# Patient Record
Sex: Female | Born: 1995 | Race: White | Hispanic: No | Marital: Single | Smoking: Never smoker
Health system: Southern US, Academic
[De-identification: ages and names within clinical notes are randomized; demographics above are authoritative.]

## PROBLEM LIST (undated history)

## (undated) ENCOUNTER — Inpatient Hospital Stay (HOSPITAL_COMMUNITY): Payer: Self-pay

## (undated) DIAGNOSIS — N926 Irregular menstruation, unspecified: Secondary | ICD-10-CM

## (undated) DIAGNOSIS — S0300XA Dislocation of jaw, unspecified side, initial encounter: Secondary | ICD-10-CM

## (undated) DIAGNOSIS — R519 Headache, unspecified: Secondary | ICD-10-CM

## (undated) DIAGNOSIS — Z789 Other specified health status: Secondary | ICD-10-CM

## (undated) HISTORY — PX: NO PAST SURGERIES: SHX2092

## (undated) HISTORY — DX: Headache, unspecified: R51.9

## (undated) HISTORY — PX: HX OTHER: 2100001105

## (undated) HISTORY — PX: HX NO SURGICAL PROCEDURES: 2100001501

## (undated) HISTORY — DX: Dislocation of jaw, unspecified side, initial encounter: S03.00XA

## (undated) HISTORY — DX: Irregular menstruation, unspecified: N92.6

---
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
## 2015-01-14 ENCOUNTER — Encounter (HOSPITAL_BASED_OUTPATIENT_CLINIC_OR_DEPARTMENT_OTHER): Payer: Self-pay | Admitting: Advanced Practice Midwife

## 2015-01-14 ENCOUNTER — Ambulatory Visit: Payer: 59 | Attending: Advanced Practice Midwife | Admitting: Advanced Practice Midwife

## 2015-01-14 VITALS — BP 114/58 | Wt 188.5 lb

## 2015-01-14 DIAGNOSIS — Z3492 Encounter for supervision of normal pregnancy, unspecified, second trimester: Secondary | ICD-10-CM | POA: Insufficient documentation

## 2015-01-14 DIAGNOSIS — Z3A16 16 weeks gestation of pregnancy: Secondary | ICD-10-CM | POA: Insufficient documentation

## 2015-01-14 DIAGNOSIS — Z3490 Encounter for supervision of normal pregnancy, unspecified, unspecified trimester: Secondary | ICD-10-CM

## 2015-01-14 DIAGNOSIS — Z3402 Encounter for supervision of normal first pregnancy, second trimester: Secondary | ICD-10-CM

## 2015-01-14 NOTE — Progress Notes (Addendum)
Patient seen by Tona SensingLena Cerbone CNM  HEre for rOB with boyfriend Gerilyn PilgrimJacob  Has been less nauseated.  No longer taking Phenergan because doesn't need.  Labs reviewed, all WNL  FAmily history updated.  First trimester screen done but 14 weeks so no result -- patient declines second trimester screening.  Moving to [place] after next visit.  Will fill out ROR at that time  Weight: 85.5 kg (188 lb 7.9 oz)  BP (Non-Invasive): (!) 114/58 mmHg    A:  IUP at [id number]      S=d    P:  OB warning signs including general s/s miscarriage, vaginal bleeding, abdominal cramping, timing of fetal movement, and unusual vaginal discharge discussed and reviewed.       RTO 4 weeks, for ROB and u/s, sooner prn.    CNM saw patient independently.  Physician available on-site for consultation, collaboration, or referral as per CNM certification and licensure.    Tona SensingLena Cerbone, CNM

## 2015-02-11 ENCOUNTER — Ambulatory Visit
Admission: RE | Admit: 2015-02-11 | Discharge: 2015-02-11 | Disposition: A | Discharge status: Home / Self Care | Payer: 59 | Source: Ambulatory Visit | Attending: Advanced Practice Midwife | Admitting: Advanced Practice Midwife

## 2015-02-11 ENCOUNTER — Encounter (HOSPITAL_BASED_OUTPATIENT_CLINIC_OR_DEPARTMENT_OTHER): Payer: Self-pay | Admitting: Advanced Practice Midwife

## 2015-02-11 ENCOUNTER — Ambulatory Visit (HOSPITAL_BASED_OUTPATIENT_CLINIC_OR_DEPARTMENT_OTHER): Payer: 59 | Admitting: Advanced Practice Midwife

## 2015-02-11 VITALS — BP 114/62 | Ht 70.0 in | Wt 194.7 lb

## 2015-02-11 DIAGNOSIS — Z3492 Encounter for supervision of normal pregnancy, unspecified, second trimester: Secondary | ICD-10-CM

## 2015-02-11 DIAGNOSIS — Z331 Pregnant state, incidental: Secondary | ICD-10-CM

## 2015-02-11 DIAGNOSIS — Z3490 Encounter for supervision of normal pregnancy, unspecified, unspecified trimester: Secondary | ICD-10-CM

## 2015-02-11 DIAGNOSIS — Z3A20 20 weeks gestation of pregnancy: Secondary | ICD-10-CM | POA: Insufficient documentation

## 2015-02-11 DIAGNOSIS — Z36 Encounter for antenatal screening of mother: Secondary | ICD-10-CM | POA: Insufficient documentation

## 2015-02-11 NOTE — Progress Notes (Addendum)
Patient seen by Tona Sensing CNM  Here for ROB with boyfriend Gerilyn Pilgrim  U/S today, all WNL s=d  It's a boy!  No name yet  Gas pain noted.  Comfort measures and OTC meds.  Had an episode of lightheadedness -- ate and got better.  Discussed and reassured.  Asks about size of baby right now.  Discussed.  Asks about sleeping on back.  Discussed.  Weight: 88.3 kg (194 lb 10.7 oz)  BP (Non-Invasive): 114/62 mmHg  Protein: Negative  Glucose: Negative  Ketones: Negative    A:  IUP at [redacted]w[redacted]d       S=d    P:  OB Warning s/s including SROM, vaginal bleeding, decreased fetal movement, and contractions reviewed.        RTO 4 weeks, sooner prn.    CNM saw patient independently.  Physician available by phone for consultation, collaboration, or referral as per CNM certification and licensure.    Tona Sensing, CNM  Jola Babinski, MD  02/14/2015, 08:29

## 2015-04-27 ENCOUNTER — Inpatient Hospital Stay (HOSPITAL_COMMUNITY)
Admission: AD | Admit: 2015-04-27 | Discharge: 2015-04-28 | Disposition: A | Discharge status: Home / Self Care | Payer: Managed Care, Other (non HMO) | Source: Ambulatory Visit | Attending: Obstetrics & Gynecology | Admitting: Obstetrics & Gynecology

## 2015-04-27 ENCOUNTER — Encounter (HOSPITAL_COMMUNITY): Payer: Self-pay | Admitting: Medical

## 2015-04-27 DIAGNOSIS — R102 Pelvic and perineal pain: Secondary | ICD-10-CM | POA: Insufficient documentation

## 2015-04-27 DIAGNOSIS — O26893 Other specified pregnancy related conditions, third trimester: Secondary | ICD-10-CM | POA: Insufficient documentation

## 2015-04-27 DIAGNOSIS — Z3A31 31 weeks gestation of pregnancy: Secondary | ICD-10-CM | POA: Insufficient documentation

## 2015-04-27 DIAGNOSIS — D72829 Elevated white blood cell count, unspecified: Secondary | ICD-10-CM

## 2015-04-27 DIAGNOSIS — O321XX0 Maternal care for breech presentation, not applicable or unspecified: Secondary | ICD-10-CM | POA: Insufficient documentation

## 2015-04-27 DIAGNOSIS — R1031 Right lower quadrant pain: Secondary | ICD-10-CM

## 2015-04-27 HISTORY — DX: Other specified health status: Z78.9

## 2015-04-27 NOTE — MAU Note (Signed)
Pt states she began having pain in her right lower abd at 2200, also began having vomiting at that time. Denies bleeding.

## 2015-04-27 NOTE — MAU Provider Note (Signed)
History     CSN: [id number]  Arrival date and time: 04/27/15 2323   First Provider Initiated Contact with Patient 04/27/15 2356      No chief complaint on file.  HPI  Linda Carter is a 19 y.o. G1P0 at [redacted]w[redacted]d who presents to MAU today with complaint of RLQ abdominal pain since 2200 tonight. She states pain is rated at 5/10 now. She states pain is worse with movement, standing and ambulation. She denies previous issues with similar pain. She denies vaginal bleeding, contractions, LOF, UTI symptoms fever or complications with the pregnancy. She reports good fetal movement. She had one episode of N/V after the pain started.   OB History    Gravida Para Term Preterm AB TAB SAB Ectopic Multiple Living   1               Past Medical History  Diagnosis Date  . Medical history non-contributory     History reviewed. No pertinent past surgical history.  History reviewed. No pertinent family history.  Social History  Substance Use Topics  . Smoking status: Never Smoker   . Smokeless tobacco: Never Used  . Alcohol Use: No    Allergies: No Known Allergies  Prescriptions prior to admission  Medication Sig Dispense Refill Last Dose  . Prenatal Vit-Fe Fumarate-FA (PRENATAL MULTIVITAMIN) TABS tablet Take 1 tablet by mouth daily at 12 noon.       Review of Systems  Constitutional: Negative for fever and malaise/fatigue.  Gastrointestinal: Positive for nausea, vomiting and abdominal pain. Negative for diarrhea and constipation.  Genitourinary: Negative for dysuria, urgency and frequency.       Neg - vaginal bleeding, discharge, LOF   Physical Exam   Blood pressure 123/78, pulse 110, temperature 98 F (36.7 C), temperature source Oral, resp. rate 16, height 5\' 10"  (1.778 m), weight 199 lb (90.266 kg), SpO2 97 %.  Physical Exam  Nursing note and vitals reviewed. Constitutional: She is oriented to person, place, and time. She appears well-developed and well-nourished. No  distress.  HENT:  Head: Normocephalic and atraumatic.  Cardiovascular: Normal rate.   Respiratory: Effort normal.  GI: Soft. She exhibits no distension and no mass. There is tenderness (moderate tenderness to palpation to the RLQ and mild in LLQ). There is no rebound and no guarding.  Neurological: She is alert and oriented to person, place, and time.  Skin: Skin is warm and dry. No erythema.  Psychiatric: She has a normal mood and affect.   Results for orders placed or performed during the hospital encounter of 04/27/15 (from the past 24 hour(s))  Urinalysis, Routine w reflex microscopic (not at [facility])     Status: Abnormal   Collection Time: 04/28/15 12:15 AM  Result Value Ref Range   Color, Urine YELLOW YELLOW   APPearance HAZY (A) CLEAR   Specific Gravity, Urine 1.015 1.005 - 1.030   pH 7.0 5.0 - 8.0   Glucose, UA NEGATIVE NEGATIVE mg/dL   Hgb urine dipstick NEGATIVE NEGATIVE   Bilirubin Urine NEGATIVE NEGATIVE   Ketones, ur NEGATIVE NEGATIVE mg/dL   Protein, ur NEGATIVE NEGATIVE mg/dL   Urobilinogen, UA 0.2 0.0 - 1.0 mg/dL   Nitrite NEGATIVE NEGATIVE   Leukocytes, UA NEGATIVE NEGATIVE  CBC with Differential/Platelet     Status: Abnormal   Collection Time: 04/28/15 12:45 AM  Result Value Ref Range   WBC 23.4 (H) 4.0 - 10.5 K/uL   RBC 4.18 3.87 - 5.11 MIL/uL   Hemoglobin 12.1  12.0 - 15.0 g/dL   HCT 16.1 (L) 09.6 - 04.5 %   MCV 85.2 78.0 - 100.0 fL   MCH 28.9 26.0 - 34.0 pg   MCHC 34.0 30.0 - 36.0 g/dL   RDW 40.9 81.1 - 91.4 %   Platelets 268 150 - 400 K/uL   Neutrophils Relative % 86 (H) 43 - 77 %   Neutro Abs 20.0 (H) 1.7 - 7.7 K/uL   Lymphocytes Relative 7 (L) 12 - 46 %   Lymphs Abs 1.7 0.7 - 4.0 K/uL   Monocytes Relative 7 3 - 12 %   Monocytes Absolute 1.6 (H) 0.1 - 1.0 K/uL   Eosinophils Relative 0 0 - 5 %   Eosinophils Absolute 0.1 0.0 - 0.7 K/uL   Basophils Relative 0 0 - 1 %   Basophils Absolute 0.0 0.0 - 0.1 K/uL  Comprehensive metabolic panel     Status:  Abnormal   Collection Time: 04/28/15  1:20 AM  Result Value Ref Range   Sodium 136 135 - 145 mmol/L   Potassium 3.6 3.5 - 5.1 mmol/L   Chloride 103 101 - 111 mmol/L   CO2 24 22 - 32 mmol/L   Glucose, Bld 97 65 - 99 mg/dL   BUN 7 6 - 20 mg/dL   Creatinine, Ser 7.82 0.44 - 1.00 mg/dL   Calcium 9.2 8.9 - 95.6 mg/dL   Total Protein 7.7 6.5 - 8.1 g/dL   Albumin 3.1 (L) 3.5 - 5.0 g/dL   AST 21 15 - 41 U/L   ALT 19 14 - 54 U/L   Alkaline Phosphatase 134 (H) 38 - 126 U/L   Total Bilirubin 0.2 (L) 0.3 - 1.2 mg/dL   GFR calc non Af Amer >60 >60 mL/min   GFR calc Af Amer >60 >60 mL/min   Anion gap 9 5 - 15   Mr Pelvis Wo Contrast  04/28/2015   CLINICAL DATA:  Patient is 31 weeks 5 days pregnant. Right lower quadrant pain beginning at 2200 hours. Vomiting. Pain in the hips.  EXAM: MRI ABDOMEN AND PELVIS WITHOUT CONTRAST  TECHNIQUE: Multiplanar multisequence MR imaging of the abdomen and pelvis was performed. No intravenous contrast was administered.  COMPARISON:  None.  FINDINGS: MRI ABDOMEN FINDINGS  Mild dilatation of renal collecting systems and ureters bilaterally, likely due to extrinsic compression from the pregnancy. Liver, spleen, pancreas, gallbladder, bile ducts, and adrenal glands are unremarkable. Colon and small bowel are not abnormally distended. No free fluid in the abdomen.  MRI PELVIS FINDINGS  A single intrauterine pregnancy is demonstrated. The fetus is in breech presentation. Placenta is posterior and fundal.  Both ovaries are visualized and appear normal. Bladder wall is not thickened. No free or loculated pelvic fluid collections. The appendix is segmentally identified and appears normal. No periappendiceal fluid is demonstrated.  IMPRESSION: No evidence to suggest appendicitis. Mild dilatation of the renal collecting systems likely due to extrinsic compression of the ureters from the pregnancy.   Electronically Signed   By: Burman Nieves M.D.   On: 04/28/2015 04:54   Mr Abdomen  Wo Contrast  04/28/2015   CLINICAL DATA:  Patient is 31 weeks 5 days pregnant. Right lower quadrant pain beginning at 2200 hours. Vomiting. Pain in the hips.  EXAM: MRI ABDOMEN AND PELVIS WITHOUT CONTRAST  TECHNIQUE: Multiplanar multisequence MR imaging of the abdomen and pelvis was performed. No intravenous contrast was administered.  COMPARISON:  None.  FINDINGS: MRI ABDOMEN FINDINGS  Mild dilatation of renal  collecting systems and ureters bilaterally, likely due to extrinsic compression from the pregnancy. Liver, spleen, pancreas, gallbladder, bile ducts, and adrenal glands are unremarkable. Colon and small bowel are not abnormally distended. No free fluid in the abdomen.  MRI PELVIS FINDINGS  A single intrauterine pregnancy is demonstrated. The fetus is in breech presentation. Placenta is posterior and fundal.  Both ovaries are visualized and appear normal. Bladder wall is not thickened. No free or loculated pelvic fluid collections. The appendix is segmentally identified and appears normal. No periappendiceal fluid is demonstrated.  IMPRESSION: No evidence to suggest appendicitis. Mild dilatation of the renal collecting systems likely due to extrinsic compression of the ureters from the pregnancy.   Electronically Signed   By: Burman Nieves M.D.   On: 04/28/2015 04:54    MAU Course  Procedures None  MDM UA today CBC, CMP today IV LR given Declines anti-emetics at this time Significantly elevated WBCs. Discussed with Dr. Langston Masker. Agrees with plan for MRI to evaluate appendix given leukocytosis, severity and location of pain.  Discussed MRI results with Dr. Langston Masker. If pain reassessment shows improvement, patient may be discharged to follow-up in the office on Friday for repeat CBC Upon return to MAU from [facility] patient rates pain at 1/10.  Assessment and Plan  A: SIUP at [redacted]w[redacted]d Round ligament pain Leukocytosis, unknown cause  P: Discharge home Warning signs for worsening condition  discussed Patient advised to follow-up with [facility] on Friday for repeat labs or sooner PRN Patient may return to MAU as needed or if her condition were to change or worsen   Marny Lowenstein, PA-C  04/28/2015, 6:00 AM

## 2015-04-28 ENCOUNTER — Ambulatory Visit (HOSPITAL_COMMUNITY)
Admit: 2015-04-28 | Discharge: 2015-04-28 | Disposition: A | Discharge status: Home / Self Care | Payer: Managed Care, Other (non HMO) | Attending: Medical | Admitting: Medical

## 2015-04-28 DIAGNOSIS — D72829 Elevated white blood cell count, unspecified: Secondary | ICD-10-CM | POA: Diagnosis not present

## 2015-04-28 DIAGNOSIS — R102 Pelvic and perineal pain: Secondary | ICD-10-CM | POA: Diagnosis not present

## 2015-04-28 DIAGNOSIS — O9989 Other specified diseases and conditions complicating pregnancy, childbirth and the puerperium: Secondary | ICD-10-CM

## 2015-04-28 DIAGNOSIS — Z3A31 31 weeks gestation of pregnancy: Secondary | ICD-10-CM | POA: Diagnosis not present

## 2015-04-28 DIAGNOSIS — R1031 Right lower quadrant pain: Secondary | ICD-10-CM

## 2015-04-28 DIAGNOSIS — O26893 Other specified pregnancy related conditions, third trimester: Secondary | ICD-10-CM | POA: Diagnosis not present

## 2015-04-28 DIAGNOSIS — O321XX0 Maternal care for breech presentation, not applicable or unspecified: Secondary | ICD-10-CM | POA: Diagnosis not present

## 2015-04-28 LAB — URINALYSIS, ROUTINE W REFLEX MICROSCOPIC
Bilirubin Urine: NEGATIVE
GLUCOSE, UA: NEGATIVE mg/dL
Hgb urine dipstick: NEGATIVE
Ketones, ur: NEGATIVE mg/dL
LEUKOCYTES UA: NEGATIVE
Nitrite: NEGATIVE
PROTEIN: NEGATIVE mg/dL
SPECIFIC GRAVITY, URINE: 1.015 (ref 1.005–1.030)
UROBILINOGEN UA: 0.2 mg/dL (ref 0.0–1.0)
pH: 7 (ref 5.0–8.0)

## 2015-04-28 LAB — COMPREHENSIVE METABOLIC PANEL
ALT: 19 U/L (ref 14–54)
AST: 21 U/L (ref 15–41)
Albumin: 3.1 g/dL — ABNORMAL LOW (ref 3.5–5.0)
Alkaline Phosphatase: 134 U/L — ABNORMAL HIGH (ref 38–126)
Anion gap: 9 (ref 5–15)
BUN: 7 mg/dL (ref 6–20)
CHLORIDE: 103 mmol/L (ref 101–111)
CO2: 24 mmol/L (ref 22–32)
CREATININE: 0.65 mg/dL (ref 0.44–1.00)
Calcium: 9.2 mg/dL (ref 8.9–10.3)
GFR calc Af Amer: >60 mL/min (ref >60)
Glucose, Bld: 97 mg/dL (ref 65–99)
POTASSIUM: 3.6 mmol/L (ref 3.5–5.1)
SODIUM: 136 mmol/L (ref 135–145)
Total Bilirubin: 0.2 mg/dL — ABNORMAL LOW (ref 0.3–1.2)
Total Protein: 7.7 g/dL (ref 6.5–8.1)

## 2015-04-28 LAB — CBC WITH DIFFERENTIAL/PLATELET
BASOS ABS: 0 10*3/uL (ref 0.0–0.1)
BASOS PCT: 0 % (ref 0–1)
EOS ABS: 0.1 10*3/uL (ref 0.0–0.7)
EOS PCT: 0 % (ref 0–5)
HCT: 35.6 % — ABNORMAL LOW (ref 36.0–46.0)
Hemoglobin: 12.1 g/dL (ref 12.0–15.0)
Lymphocytes Relative: 7 % — ABNORMAL LOW (ref 12–46)
Lymphs Abs: 1.7 10*3/uL (ref 0.7–4.0)
MCH: 28.9 pg (ref 26.0–34.0)
MCHC: 34 g/dL (ref 30.0–36.0)
MCV: 85.2 fL (ref 78.0–100.0)
MONO ABS: 1.6 10*3/uL — AB (ref 0.1–1.0)
Monocytes Relative: 7 % (ref 3–12)
Neutro Abs: 20 10*3/uL — ABNORMAL HIGH (ref 1.7–7.7)
Neutrophils Relative %: 86 % — ABNORMAL HIGH (ref 43–77)
PLATELETS: 268 10*3/uL (ref 150–400)
RBC: 4.18 MIL/uL (ref 3.87–5.11)
RDW: 14 % (ref 11.5–15.5)
WBC: 23.4 10*3/uL — AB (ref 4.0–10.5)

## 2015-04-28 MED ORDER — ONDANSETRON HCL 4 MG/2ML IJ SOLN
4.0000 mg | Freq: Once | INTRAMUSCULAR | Status: DC
  Filled 2015-04-28: qty 2

## 2015-04-28 MED ORDER — LACTATED RINGERS IV SOLN
INTRAVENOUS | Status: DC
  Administered 2015-04-28: 02:00:00 via INTRAVENOUS

## 2015-04-28 MED ORDER — LACTATED RINGERS IV BOLUS (SEPSIS)
1000.0000 mL | Freq: Once | INTRAVENOUS | Status: AC
  Administered 2015-04-28: 1000 mL via INTRAVENOUS

## 2015-04-28 NOTE — Discharge Instructions (Signed)
Abdominal Pain During Pregnancy °Belly (abdominal) pain is common during pregnancy. Most of the time, it is not a serious problem. Other times, it can be a sign that something is wrong with the pregnancy. Always tell your doctor if you have belly pain. °HOME CARE °Monitor your belly pain for any changes. The following actions may help you feel better: °· Do not have sex (intercourse) or put anything in your vagina until you feel better. °· Rest until your pain stops. °· Drink clear fluids if you feel sick to your stomach (nauseous). Do not eat solid food until you feel better. °· Only take medicine as told by your doctor. °· Keep all doctor visits as told. °GET HELP RIGHT AWAY IF:  °· You are bleeding, leaking fluid, or pieces of tissue come out of your vagina. °· You have more pain or cramping. °· You keep throwing up (vomiting). °· You have pain when you pee (urinate) or have blood in your pee. °· You have a fever. °· You do not feel your baby moving as much. °· You feel very weak or feel like passing out. °· You have trouble breathing, with or without belly pain. °· You have a very bad headache and belly pain. °· You have fluid leaking from your vagina and belly pain. °· You keep having watery poop (diarrhea). °· Your belly pain does not go away after resting, or the pain gets worse. °MAKE SURE YOU:  °· Understand these instructions. °· Will watch your condition. °· Will get help right away if you are not doing well or get worse. °Document Released: 07/25/2009 Document Revised: 04/08/2013 Document Reviewed: 03/05/2013 °[employer]® Patient Information ©2015 [employer]. This information is not intended to replace advice given to you by your health care provider. Make sure you discuss any questions you have with your health care provider. ° ° °Round Ligament Pain During Pregnancy  ° °Round ligament pain is a sharp pain or jabbing feeling often felt in the lower belly or groin area on one or both sides. It is one of  the most common complaints during pregnancy and is considered a normal part of pregnancy. It is most often felt during the second trimester.  ° °Here is what you need to know about round ligament pain, including some tips to help you feel better.  ° °Causes of Round Ligament Pain:   ° °Several thick ligaments surround and support your womb (uterus) as it grows during pregnancy. One of them is called the round ligament.  ° °The round ligament connects the front part of the womb to your groin, the area where your legs attach to your pelvis. The round ligament normally tightens and relaxes slowly.  ° °As your baby and womb grow, the round ligament stretches. That makes it more likely to become strained.  ° °Sudden movements can cause the ligament to tighten quickly, like a rubber band snapping. This causes a sudden and quick jabbing feeling.  ° °Symptoms of Round Ligament Pain  ° °Round ligament pain can be concerning and uncomfortable. But it is considered normal as your body changes during pregnancy.  ° °The symptoms of round ligament pain include a sharp, sudden spasm in the belly. It usually affects the right side, but it may happen on both sides. The pain only lasts a few seconds.  ° °Exercise may cause the pain, as will rapid movements such as:  ° °sneezing  °coughing  °laughing  °rolling over in bed  °standing up   too quickly   Treatment of Round Ligament Pain   Here are some tips that may help reduce your discomfort:   Pain relief. Take over-the-counter acetaminophen for pain, if necessary. Ask your doctor if this is OK.   Exercise. Get plenty of exercise to keep your stomach (core) muscles strong. Doing stretching exercises or prenatal yoga can be helpful. Ask your doctor which exercises are safe for you and your baby.   A helpful exercise involves putting your hands and knees on the floor, lowering your head, and pushing your backside into the air.   Avoid sudden movements. Change positions slowly  (such as standing up or sitting down) to avoid sudden movements that may cause stretching and pain.   Flex your hips. Bend and flex your hips before you cough, sneeze, or laugh to avoid pulling on the ligaments.   Apply warmth. A heating pad or warm bath may be helpful. Ask your doctor if this is OK. Extreme heat can be dangerous to the baby.   You should try to modify your daily activity level and avoid positions that may worsen the condition.   When to Call the Doctor/Midwife   Always tell your doctor or midwife about any type of pain you have during pregnancy. Round ligament pain is quick and doesn't last long.   Call your health care provider immediately if you have:   severe pain  fever  chills  pain on urination  difficulty walking   Belly pain during pregnancy can be due to many different causes. It is important for your doctor to rule out more serious conditions, including pregnancy complications such as placenta abruption or non-pregnancy illnesses such as:   inguinal hernia  appendicitis  stomach, liver, and kidney problems  Preterm labor pains may sometimes be mistaken for round ligament pain.

## 2015-04-28 NOTE — MAU Note (Signed)
[facility] notified of need for transport to [facility] for MRI

## 2015-06-23 ENCOUNTER — Inpatient Hospital Stay (EMERGENCY_DEPARTMENT_HOSPITAL)
Admission: AD | Admit: 2015-06-23 | Discharge: 2015-06-23 | Disposition: A | Discharge status: Home / Self Care | Payer: Managed Care, Other (non HMO) | Source: Ambulatory Visit | Attending: Obstetrics and Gynecology | Admitting: Obstetrics and Gynecology

## 2015-06-23 ENCOUNTER — Encounter (HOSPITAL_COMMUNITY): Payer: Self-pay | Admitting: *Deleted

## 2015-06-23 DIAGNOSIS — O321XX0 Maternal care for breech presentation, not applicable or unspecified: Secondary | ICD-10-CM | POA: Diagnosis not present

## 2015-06-23 DIAGNOSIS — O133 Gestational [pregnancy-induced] hypertension without significant proteinuria, third trimester: Secondary | ICD-10-CM | POA: Diagnosis not present

## 2015-06-23 LAB — URIC ACID: URIC ACID, SERUM: 3.8 mg/dL (ref 2.3–6.6)

## 2015-06-23 LAB — COMPREHENSIVE METABOLIC PANEL
ALBUMIN: 3.1 g/dL — AB (ref 3.5–5.0)
ALK PHOS: 167 U/L — AB (ref 38–126)
ALT: 19 U/L (ref 14–54)
AST: 23 U/L (ref 15–41)
Anion gap: 8 (ref 5–15)
BILIRUBIN TOTAL: 0.3 mg/dL (ref 0.3–1.2)
BUN: 12 mg/dL (ref 6–20)
CALCIUM: 10.1 mg/dL (ref 8.9–10.3)
CO2: 20 mmol/L — ABNORMAL LOW (ref 22–32)
CREATININE: 0.64 mg/dL (ref 0.44–1.00)
Chloride: 106 mmol/L (ref 101–111)
GFR calc Af Amer: >60 mL/min (ref >60)
GLUCOSE: 75 mg/dL (ref 65–99)
Potassium: 4.1 mmol/L (ref 3.5–5.1)
Sodium: 134 mmol/L — ABNORMAL LOW (ref 135–145)
TOTAL PROTEIN: 7.2 g/dL (ref 6.5–8.1)

## 2015-06-23 LAB — CBC
HEMATOCRIT: 36.8 % (ref 36.0–46.0)
HEMOGLOBIN: 12.6 g/dL (ref 12.0–15.0)
MCH: 29.4 pg (ref 26.0–34.0)
MCHC: 34.2 g/dL (ref 30.0–36.0)
MCV: 85.8 fL (ref 78.0–100.0)
Platelets: 276 10*3/uL (ref 150–400)
RBC: 4.29 MIL/uL (ref 3.87–5.11)
RDW: 15.6 % — AB (ref 11.5–15.5)
WBC: 14.5 10*3/uL — AB (ref 4.0–10.5)

## 2015-06-23 LAB — PROTEIN / CREATININE RATIO, URINE
Creatinine, Urine: 38 mg/dL
Protein Creatinine Ratio: 0.18 mg/mg{Cre} — ABNORMAL HIGH (ref 0.00–0.15)
TOTAL PROTEIN, URINE: 7 mg/dL

## 2015-06-23 LAB — LACTATE DEHYDROGENASE: LDH: 156 U/L (ref 98–192)

## 2015-06-23 MED ORDER — ASPIRIN 81 MG PO CHEW: 324.0000 mg | CHEWABLE_TABLET | Freq: Once | ORAL | Status: DC

## 2015-06-23 NOTE — Discharge Instructions (Signed)
Hypertension During Pregnancy °Hypertension is also called high blood pressure. Blood pressure moves blood in your body. Sometimes, the force that moves the blood becomes too strong. When you are pregnant, this condition should be watched carefully. It can cause problems for you and your baby. °HOME CARE  °· Make and keep all of your doctor visits. °· Take medicine as told by your doctor. Tell your doctor about all medicines you take. °· Eat very little salt. °· Exercise regularly. °· Do not drink alcohol. °· Do not smoke. °· Do not have drinks with caffeine. °· Lie on your left side when resting. °· Your health care provider may ask you to take one low-dose aspirin (81mg) each day. °GET HELP RIGHT AWAY IF: °· You have bad belly (abdominal) pain. °· You have sudden puffiness (swelling) in the hands, ankles, or face. °· You gain 4 pounds (1.8 kilograms) or more in 1 week. °· You throw up (vomit) repeatedly. °· You have bleeding from the vagina. °· You do not feel the baby moving as much. °· You have a headache. °· You have blurred or double vision. °· You have muscle twitching or spasms. °· You have shortness of breath. °· You have blue fingernails and lips. °· You have blood in your pee (urine). °MAKE SURE YOU: °· Understand these instructions. °· Will watch your condition. °· Will get help right away if you are not doing well or get worse. °  °This information is not intended to replace advice given to you by your health care provider. Make sure you discuss any questions you have with your health care provider. °  °Document Released: 09/08/2010 Document Revised: 08/27/2014 Document Reviewed: 03/05/2013 °Elsevier Interactive Patient Education ©2016 [employer] ° °

## 2015-06-23 NOTE — MAU Provider Note (Signed)
History   [id number]   Chief Complaint  Patient presents with  . Hypertension    HPI Linda Carter is a 19 y.o. female  G1P0 at [redacted]w[redacted]d IUP sent over from office for elevated blood pressures.  In the office patient blood pressures reported as 140's/90's.  Pt denies headache, vision changes, or epigastric pain.  .  Denies vaginal bleeding, leaking of fluid.  Felt two contractions while in MAU..  +fetal movement.   No LMP recorded. Patient is pregnant.  OB History  Gravida Para Term Preterm AB SAB TAB Ectopic Multiple Living  1             # Outcome Date GA Lbr Len/2nd Weight Sex Delivery Anes PTL Lv  1 Current               Past Medical History  Diagnosis Date  . Medical history non-contributory     No family history on file.  Social History   Social History  . Marital Status: Single    Spouse Name: N/A  . Number of Children: N/A  . Years of Education: N/A   Social History Main Topics  . Smoking status: Never Smoker   . Smokeless tobacco: Never Used  . Alcohol Use: No  . Drug Use: No  . Sexual Activity: Yes   Other Topics Concern  . None   Social History Narrative    No Known Allergies  No current facility-administered medications on file prior to encounter.   Current Outpatient Prescriptions on File Prior to Encounter  Medication Sig Dispense Refill  . Prenatal Vit-Fe Fumarate-FA (PRENATAL MULTIVITAMIN) TABS tablet Take 1 tablet by mouth daily at 12 noon.       Review of Systems  Eyes: Negative for visual disturbance.  Genitourinary: Negative for vaginal bleeding and vaginal discharge.  Neurological: Negative for dizziness and headaches.  All other systems reviewed and are negative.    Physical Exam   Filed Vitals:   06/23/15 1609 06/23/15 1616 06/23/15 1621 06/23/15 1630  BP: 148/89 138/101  147/91  Pulse: 86 92  93  Resp: 18 18    Height:    (1.778 m)   Weight:   218 lb (98.884 kg)     Physical Exam  Constitutional: She is  oriented to person, place, and time. She appears well-developed and well-nourished. No distress.  HENT:  Head: Normocephalic.  Eyes: Pupils are equal, round, and reactive to light.  Neck: Normal range of motion. Neck supple.  Cardiovascular: Normal rate and regular rhythm.   Respiratory: Effort normal and breath sounds normal.  GI: Soft. There is no tenderness.  Genitourinary: No bleeding in the vagina.  Musculoskeletal: Normal range of motion. She exhibits edema.  Trace pedal edema  Neurological: She is alert and oriented to person, place, and time. She has normal reflexes. She displays normal reflexes.  Skin: Skin is warm and dry.   Breech presentation (per pt report in office) MAU Course  Procedures  MDM Results for orders placed or performed during the hospital encounter of 06/23/15 (from the past 24 hour(s))  Protein / creatinine ratio, urine     Status: Abnormal   Collection Time: 06/23/15  3:58 PM  Result Value Ref Range   Creatinine, Urine 38.00 mg/dL   Total Protein, Urine 7 mg/dL   Protein Creatinine Ratio 0.18 (H) 0.00 - 0.15 mg/mg[Cre]  CBC     Status: Abnormal   Collection Time: 06/23/15  5:16 PM  Result Value Ref  Range   WBC 14.5 (H) 4.0 - 10.5 K/uL   RBC 4.29 3.87 - 5.11 MIL/uL   Hemoglobin 12.6 12.0 - 15.0 g/dL   HCT 16.136.8 09.636.0 - 04.546.0 %   MCV 85.8 78.0 - 100.0 fL   MCH 29.4 26.0 - 34.0 pg   MCHC 34.2 30.0 - 36.0 g/dL   RDW 40.915.6 (H) 81.111.5 - 91.415.5 %   Platelets 276 150 - 400 K/uL  Comprehensive metabolic panel     Status: Abnormal   Collection Time: 06/23/15  5:16 PM  Result Value Ref Range   Sodium 134 (L) 135 - 145 mmol/L   Potassium 4.1 3.5 - 5.1 mmol/L   Chloride 106 101 - 111 mmol/L   CO2 20 (L) 22 - 32 mmol/L   Glucose, Bld 75 65 - 99 mg/dL   BUN 12 6 - 20 mg/dL   Creatinine, Ser 7.820.64 0.44 - 1.00 mg/dL   Calcium 95.610.1 8.9 - 21.310.3 mg/dL   Total Protein 7.2 6.5 - 8.1 g/dL   Albumin 3.1 (L) 3.5 - 5.0 g/dL   AST 23 15 - 41 U/L   ALT 19 14 - 54 U/L    Alkaline Phosphatase 167 (H) 38 - 126 U/L   Total Bilirubin 0.3 0.3 - 1.2 mg/dL   GFR calc non Af Amer >60 >60 mL/min   GFR calc Af Amer >60 >60 mL/min   Anion gap 8 5 - 15  Uric acid     Status: None   Collection Time: 06/23/15  5:16 PM  Result Value Ref Range   Uric Acid, Serum 3.8 2.3 - 6.6 mg/dL  Lactate dehydrogenase     Status: None   Collection Time: 06/23/15  5:16 PM  Result Value Ref Range   LDH 156 98 - 192 U/L   FHR 120's, +accels, reactive 1815 Consulted with Dr. Henderson Cloudomblin > reviewed labs/vitals > discharge to home with return in AM Assessment and Plan  19 y.o. G1P0 at 6229w5d IUP  Gestational Hypertension Reactive NST  Discharge to home Preeclampsia precautions Kick counts Return in AM for scheduled CSection  Marlis EdelsonWalidah N Karim, CNM 06/23/2015 6:27 PM

## 2015-06-24 ENCOUNTER — Inpatient Hospital Stay (HOSPITAL_COMMUNITY): Payer: Managed Care, Other (non HMO) | Admitting: Anesthesiology

## 2015-06-24 ENCOUNTER — Inpatient Hospital Stay (HOSPITAL_COMMUNITY)
Admission: RE | Admit: 2015-06-24 | Discharge: 2015-06-27 | DRG: 766 | Disposition: A | Discharge status: Home / Self Care | Payer: Managed Care, Other (non HMO) | Source: Ambulatory Visit | Attending: Obstetrics and Gynecology | Admitting: Obstetrics and Gynecology

## 2015-06-24 ENCOUNTER — Encounter (HOSPITAL_COMMUNITY)
Admission: RE | Disposition: A | Discharge status: Home / Self Care | Payer: Self-pay | Source: Ambulatory Visit | Attending: Obstetrics and Gynecology

## 2015-06-24 ENCOUNTER — Encounter (HOSPITAL_COMMUNITY): Payer: Self-pay | Admitting: Anesthesiology

## 2015-06-24 DIAGNOSIS — O134 Gestational [pregnancy-induced] hypertension without significant proteinuria, complicating childbirth: Secondary | ICD-10-CM | POA: Diagnosis present

## 2015-06-24 DIAGNOSIS — E669 Obesity, unspecified: Secondary | ICD-10-CM | POA: Diagnosis present

## 2015-06-24 DIAGNOSIS — O99214 Obesity complicating childbirth: Secondary | ICD-10-CM | POA: Diagnosis present

## 2015-06-24 DIAGNOSIS — O321XX0 Maternal care for breech presentation, not applicable or unspecified: Principal | ICD-10-CM | POA: Diagnosis present

## 2015-06-24 DIAGNOSIS — K219 Gastro-esophageal reflux disease without esophagitis: Secondary | ICD-10-CM | POA: Diagnosis present

## 2015-06-24 DIAGNOSIS — Z3A39 39 weeks gestation of pregnancy: Secondary | ICD-10-CM

## 2015-06-24 DIAGNOSIS — Z6831 Body mass index (BMI) 31.0-31.9, adult: Secondary | ICD-10-CM | POA: Diagnosis not present

## 2015-06-24 DIAGNOSIS — O9962 Diseases of the digestive system complicating childbirth: Secondary | ICD-10-CM | POA: Diagnosis present

## 2015-06-24 DIAGNOSIS — Z98891 History of uterine scar from previous surgery: Secondary | ICD-10-CM

## 2015-06-24 LAB — RPR: RPR: NONREACTIVE

## 2015-06-24 LAB — TYPE AND SCREEN
ABO/RH(D): O POS
ANTIBODY SCREEN: NEGATIVE

## 2015-06-24 LAB — ABO/RH: ABO/RH(D): O POS

## 2015-06-24 SURGERY — Surgical Case
Anesthesia: Spinal

## 2015-06-24 MED ORDER — SENNOSIDES-DOCUSATE SODIUM 8.6-50 MG PO TABS
2.0000 | ORAL_TABLET | ORAL | Status: DC
  Administered 2015-06-24 – 2015-06-27 (×3): 2 via ORAL
  Filled 2015-06-24 (×3): qty 2

## 2015-06-24 MED ORDER — MEPERIDINE HCL 25 MG/ML IJ SOLN: 6.2500 mg | INTRAMUSCULAR | Status: DC | PRN

## 2015-06-24 MED ORDER — NALBUPHINE HCL 10 MG/ML IJ SOLN: 5.0000 mg | INTRAMUSCULAR | Status: DC | PRN

## 2015-06-24 MED ORDER — SIMETHICONE 80 MG PO CHEW: 80.0000 mg | CHEWABLE_TABLET | ORAL | Status: DC | PRN

## 2015-06-24 MED ORDER — ONDANSETRON HCL 4 MG/2ML IJ SOLN
INTRAMUSCULAR | Status: AC
  Filled 2015-06-24: qty 2

## 2015-06-24 MED ORDER — PHENYLEPHRINE 8 MG IN D5W 100 ML (0.08MG/ML) PREMIX OPTIME
INJECTION | INTRAVENOUS | Status: AC
  Filled 2015-06-24: qty 100

## 2015-06-24 MED ORDER — NALOXONE HCL 2 MG/2ML IJ SOSY
1.0000 ug/kg/h | PREFILLED_SYRINGE | INTRAVENOUS | Status: DC | PRN
  Filled 2015-06-24: qty 2

## 2015-06-24 MED ORDER — CEFAZOLIN SODIUM-DEXTROSE 2-3 GM-% IV SOLR
2.0000 g | INTRAVENOUS | Status: AC
  Administered 2015-06-24: 2 g via INTRAVENOUS
  Filled 2015-06-24: qty 50

## 2015-06-24 MED ORDER — ONDANSETRON HCL 4 MG/2ML IJ SOLN
INTRAMUSCULAR | Status: DC | PRN
  Administered 2015-06-24: 4 mg via INTRAVENOUS

## 2015-06-24 MED ORDER — LANOLIN HYDROUS EX OINT
1.0000 | TOPICAL_OINTMENT | CUTANEOUS | Status: DC | PRN
Start: 2015-06-24 — End: 2015-06-27

## 2015-06-24 MED ORDER — FENTANYL CITRATE (PF) 100 MCG/2ML IJ SOLN
INTRAMUSCULAR | Status: AC
  Filled 2015-06-24: qty 4

## 2015-06-24 MED ORDER — OXYCODONE-ACETAMINOPHEN 5-325 MG PO TABS
1.0000 | ORAL_TABLET | ORAL | Status: DC | PRN
  Administered 2015-06-26: 1 via ORAL
  Filled 2015-06-24: qty 1

## 2015-06-24 MED ORDER — BUPIVACAINE IN DEXTROSE 0.75-8.25 % IT SOLN
INTRATHECAL | Status: DC | PRN
  Administered 2015-06-24: 2 mL via INTRATHECAL

## 2015-06-24 MED ORDER — ZOLPIDEM TARTRATE 5 MG PO TABS: 5.0000 mg | ORAL_TABLET | Freq: Every evening | ORAL | Status: DC | PRN

## 2015-06-24 MED ORDER — KETOROLAC TROMETHAMINE 30 MG/ML IJ SOLN: 30.0000 mg | Freq: Four times a day (QID) | INTRAMUSCULAR | Status: AC | PRN

## 2015-06-24 MED ORDER — SIMETHICONE 80 MG PO CHEW
80.0000 mg | CHEWABLE_TABLET | Freq: Three times a day (TID) | ORAL | Status: DC
  Administered 2015-06-24 – 2015-06-27 (×7): 80 mg via ORAL
  Filled 2015-06-24 (×7): qty 1

## 2015-06-24 MED ORDER — PHENYLEPHRINE 8 MG IN D5W 100 ML (0.08MG/ML) PREMIX OPTIME
INJECTION | INTRAVENOUS | Status: DC | PRN
  Administered 2015-06-24: 60 ug/min via INTRAVENOUS

## 2015-06-24 MED ORDER — DIPHENHYDRAMINE HCL 50 MG/ML IJ SOLN: 12.5000 mg | INTRAMUSCULAR | Status: DC | PRN

## 2015-06-24 MED ORDER — IBUPROFEN 600 MG PO TABS
600.0000 mg | ORAL_TABLET | Freq: Four times a day (QID) | ORAL | Status: DC | PRN
  Filled 2015-06-24: qty 1

## 2015-06-24 MED ORDER — PRENATAL MULTIVITAMIN CH
1.0000 | ORAL_TABLET | Freq: Every day | ORAL | Status: DC
  Administered 2015-06-25 – 2015-06-27 (×3): 1 via ORAL
  Filled 2015-06-24 (×3): qty 1

## 2015-06-24 MED ORDER — FENTANYL CITRATE (PF) 100 MCG/2ML IJ SOLN
INTRAMUSCULAR | Status: DC | PRN
Start: 2015-06-24 — End: 2015-06-24
  Administered 2015-06-24: 25 ug via INTRATHECAL

## 2015-06-24 MED ORDER — WITCH HAZEL-GLYCERIN EX PADS: 1.0000 "application " | MEDICATED_PAD | CUTANEOUS | Status: DC | PRN

## 2015-06-24 MED ORDER — TETANUS-DIPHTH-ACELL PERTUSSIS 5-2.5-18.5 LF-MCG/0.5 IM SUSP: 0.5000 mL | Freq: Once | INTRAMUSCULAR | Status: DC

## 2015-06-24 MED ORDER — SODIUM CHLORIDE 0.9 % IJ SOLN: 3.0000 mL | INTRAMUSCULAR | Status: DC | PRN

## 2015-06-24 MED ORDER — ACETAMINOPHEN 325 MG PO TABS
650.0000 mg | ORAL_TABLET | ORAL | Status: DC | PRN
  Administered 2015-06-25: 650 mg via ORAL
  Filled 2015-06-24: qty 2

## 2015-06-24 MED ORDER — SCOPOLAMINE 1 MG/3DAYS TD PT72
MEDICATED_PATCH | TRANSDERMAL | Status: AC
  Filled 2015-06-24: qty 1

## 2015-06-24 MED ORDER — DIPHENHYDRAMINE HCL 25 MG PO CAPS: 25.0000 mg | ORAL_CAPSULE | ORAL | Status: DC | PRN

## 2015-06-24 MED ORDER — IBUPROFEN 600 MG PO TABS
600.0000 mg | ORAL_TABLET | Freq: Four times a day (QID) | ORAL | Status: DC
  Administered 2015-06-24 – 2015-06-27 (×11): 600 mg via ORAL
  Filled 2015-06-24 (×11): qty 1

## 2015-06-24 MED ORDER — DIBUCAINE 1 % RE OINT: 1.0000 "application " | TOPICAL_OINTMENT | RECTAL | Status: DC | PRN

## 2015-06-24 MED ORDER — ONDANSETRON HCL 4 MG/2ML IJ SOLN: 4.0000 mg | Freq: Three times a day (TID) | INTRAMUSCULAR | Status: DC | PRN

## 2015-06-24 MED ORDER — MENTHOL 3 MG MT LOZG: 1.0000 | LOZENGE | OROMUCOSAL | Status: DC | PRN

## 2015-06-24 MED ORDER — METOCLOPRAMIDE HCL 5 MG/ML IJ SOLN
10.0000 mg | Freq: Four times a day (QID) | INTRAMUSCULAR | Status: DC | PRN
  Administered 2015-06-24: 10 mg via INTRAVENOUS
  Filled 2015-06-24: qty 2

## 2015-06-24 MED ORDER — OXYCODONE-ACETAMINOPHEN 5-325 MG PO TABS: 2.0000 | ORAL_TABLET | ORAL | Status: DC | PRN

## 2015-06-24 MED ORDER — NALBUPHINE HCL 10 MG/ML IJ SOLN: 5.0000 mg | Freq: Once | INTRAMUSCULAR | Status: DC | PRN

## 2015-06-24 MED ORDER — DEXAMETHASONE SODIUM PHOSPHATE 4 MG/ML IJ SOLN
INTRAMUSCULAR | Status: AC
  Filled 2015-06-24: qty 1

## 2015-06-24 MED ORDER — FENTANYL CITRATE (PF) 100 MCG/2ML IJ SOLN: 25.0000 ug | INTRAMUSCULAR | Status: DC | PRN

## 2015-06-24 MED ORDER — CEFAZOLIN SODIUM-DEXTROSE 2-3 GM-% IV SOLR
INTRAVENOUS | Status: AC
  Filled 2015-06-24: qty 50

## 2015-06-24 MED ORDER — MORPHINE SULFATE (PF) 0.5 MG/ML IJ SOLN
INTRAMUSCULAR | Status: DC | PRN
  Administered 2015-06-24: .15 mg via EPIDURAL

## 2015-06-24 MED ORDER — SCOPOLAMINE 1 MG/3DAYS TD PT72
MEDICATED_PATCH | TRANSDERMAL | Status: AC
  Administered 2015-06-24: 1.5 mg
  Filled 2015-06-24: qty 1

## 2015-06-24 MED ORDER — OXYTOCIN 10 UNIT/ML IJ SOLN
INTRAMUSCULAR | Status: AC
  Filled 2015-06-24: qty 4

## 2015-06-24 MED ORDER — LACTATED RINGERS IV SOLN
INTRAVENOUS | Status: DC
  Administered 2015-06-25: 01:00:00 via INTRAVENOUS

## 2015-06-24 MED ORDER — NALOXONE HCL 0.4 MG/ML IJ SOLN: 0.4000 mg | INTRAMUSCULAR | Status: DC | PRN

## 2015-06-24 MED ORDER — MORPHINE SULFATE (PF) 0.5 MG/ML IJ SOLN
INTRAMUSCULAR | Status: AC
  Filled 2015-06-24: qty 100

## 2015-06-24 MED ORDER — LACTATED RINGERS IV SOLN
INTRAVENOUS | Status: DC
  Administered 2015-06-24 (×3): via INTRAVENOUS

## 2015-06-24 MED ORDER — DIPHENHYDRAMINE HCL 25 MG PO CAPS: 25.0000 mg | ORAL_CAPSULE | Freq: Four times a day (QID) | ORAL | Status: DC | PRN

## 2015-06-24 MED ORDER — SIMETHICONE 80 MG PO CHEW
80.0000 mg | CHEWABLE_TABLET | ORAL | Status: DC
  Administered 2015-06-24 – 2015-06-27 (×3): 80 mg via ORAL
  Filled 2015-06-24 (×3): qty 1

## 2015-06-24 MED ORDER — OXYTOCIN 10 UNIT/ML IJ SOLN
40.0000 [IU] | INTRAVENOUS | Status: DC | PRN
  Administered 2015-06-24: 40 [IU] via INTRAVENOUS

## 2015-06-24 MED ORDER — DEXAMETHASONE SODIUM PHOSPHATE 4 MG/ML IJ SOLN
INTRAMUSCULAR | Status: DC | PRN
  Administered 2015-06-24: 4 mg via INTRAVENOUS

## 2015-06-24 MED ORDER — OXYTOCIN 40 UNITS IN LACTATED RINGERS INFUSION - SIMPLE MED
62.5000 mL/h | INTRAVENOUS | Status: AC
Start: 2015-06-24 — End: 2015-06-25

## 2015-06-24 MED ORDER — 0.9 % SODIUM CHLORIDE (POUR BTL) OPTIME
TOPICAL | Status: DC | PRN
  Administered 2015-06-24: 1000 mL

## 2015-06-24 SURGICAL SUPPLY — 33 items
BARRIER ADHS 3X4 INTERCEED (GAUZE/BANDAGES/DRESSINGS) IMPLANT
BENZOIN TINCTURE PRP APPL 2/3 (GAUZE/BANDAGES/DRESSINGS) ×3 IMPLANT
CLAMP CORD UMBIL (MISCELLANEOUS) IMPLANT
CLOSURE WOUND 1/2 X4 (GAUZE/BANDAGES/DRESSINGS) ×1
CLOTH BEACON ORANGE TIMEOUT ST (SAFETY) ×3 IMPLANT
CONTAINER PREFILL 10% NBF 15ML (MISCELLANEOUS) IMPLANT
DRAPE SHEET LG 3/4 BI-LAMINATE (DRAPES) IMPLANT
DRSG OPSITE POSTOP 4X10 (GAUZE/BANDAGES/DRESSINGS) ×3 IMPLANT
DURAPREP 26ML APPLICATOR (WOUND CARE) ×3 IMPLANT
ELECT REM PT RETURN 9FT ADLT (ELECTROSURGICAL) ×3
ELECTRODE REM PT RTRN 9FT ADLT (ELECTROSURGICAL) ×1 IMPLANT
EXTRACTOR VACUUM M CUP 4 TUBE (SUCTIONS) IMPLANT
EXTRACTOR VACUUM M CUP 4' TUBE (SUCTIONS)
GLOVE BIO SURGEON STRL SZ 6.5 (GLOVE) ×2 IMPLANT
GLOVE BIO SURGEONS STRL SZ 6.5 (GLOVE) ×1
GOWN STRL REUS W/TWL LRG LVL3 (GOWN DISPOSABLE) ×6 IMPLANT
KIT ABG SYR 3ML LUER SLIP (SYRINGE) IMPLANT
NEEDLE HYPO 22GX1.5 SAFETY (NEEDLE) IMPLANT
NEEDLE HYPO 25X5/8 SAFETYGLIDE (NEEDLE) ×3 IMPLANT
NS IRRIG 1000ML POUR BTL (IV SOLUTION) ×3 IMPLANT
PACK C SECTION WH (CUSTOM PROCEDURE TRAY) ×3 IMPLANT
PAD OB MATERNITY 4.3X12.25 (PERSONAL CARE ITEMS) ×3 IMPLANT
PENCIL SMOKE EVAC W/HOLSTER (ELECTROSURGICAL) ×3 IMPLANT
STRIP CLOSURE SKIN 1/2X4 (GAUZE/BANDAGES/DRESSINGS) ×2 IMPLANT
SUT CHROMIC 0 CTX 36 (SUTURE) ×6 IMPLANT
SUT PLAIN 0 NONE (SUTURE) IMPLANT
SUT PLAIN 2 0 XLH (SUTURE) IMPLANT
SUT VIC AB 0 CT1 27 (SUTURE) ×6
SUT VIC AB 0 CT1 27XBRD ANBCTR (SUTURE) ×3 IMPLANT
SUT VIC AB 4-0 KS 27 (SUTURE) ×3 IMPLANT
SYR CONTROL 10ML LL (SYRINGE) IMPLANT
TOWEL OR 17X24 6PK STRL BLUE (TOWEL DISPOSABLE) ×3 IMPLANT
TRAY FOLEY CATH SILVER 14FR (SET/KITS/TRAYS/PACK) ×3 IMPLANT

## 2015-06-24 NOTE — Anesthesia Preprocedure Evaluation (Signed)
Anesthesia Evaluation  Patient identified by MRN, date of birth, ID band Patient awake    Reviewed: Allergy & Precautions, NPO status , Patient's Chart, lab work & pertinent test results  Airway Mallampati: II  TM Distance: >3 FB Neck ROM: Full    Dental no notable dental hx. (+) Teeth Intact   Pulmonary neg pulmonary ROS,    Pulmonary exam normal breath sounds clear to auscultation- rhonchi       Cardiovascular hypertension, Normal cardiovascular exam Rhythm:Regular Rate:Normal     Neuro/Psych negative neurological ROS  negative psych ROS   GI/Hepatic Neg liver ROS, GERD  Medicated and Controlled,  Endo/Other  Obesity  Renal/GU negative Renal ROS  negative genitourinary   Musculoskeletal negative musculoskeletal ROS (+)   Abdominal (+) + obese,   Peds  Hematology negative hematology ROS (+)   Anesthesia Other Findings   Reproductive/Obstetrics (+) Pregnancy Breech presentation PIH                             Anesthesia Physical Anesthesia Plan  ASA: III  Anesthesia Plan: Spinal   Post-op Pain Management:    Induction:   Airway Management Planned: Natural Airway  Additional Equipment:   Intra-op Plan:   Post-operative Plan:   Informed Consent: I have reviewed the patients History and Physical, chart, labs and discussed the procedure including the risks, benefits and alternatives for the proposed anesthesia with the patient or authorized representative who has indicated his/her understanding and acceptance.     Plan Discussed with: Anesthesiologist, CRNA and Surgeon  Anesthesia Plan Comments:         Anesthesia Quick Evaluation

## 2015-06-24 NOTE — Addendum Note (Signed)
Addendum  created 06/24/15 1844 by Yolonda KidaAlison L Milaya Hora, CRNA   Modules edited: Notes Section   Notes Section:  File: [id number]

## 2015-06-24 NOTE — Transfer of Care (Signed)
Immediate Anesthesia Transfer of Care Note  Patient: Linda Carter  Procedure(s) Performed: Procedure(s) with comments: CESAREAN SECTION (N/A) - primary edc 06/25/15 NKDA  Patient Location: PACU  Anesthesia Type:Spinal  Level of Consciousness: awake, alert  and oriented  Airway & Oxygen Therapy: Patient Spontanous Breathing  Post-op Assessment: Report given to RN and Post -op Vital signs reviewed and stable  Post vital signs: Reviewed and stable  Last Vitals:  Filed Vitals:   06/24/15 0731  BP: 142/94  Pulse: 90  Temp: 36.8 C  Resp: 20    Complications: No apparent anesthesia complications

## 2015-06-24 NOTE — Brief Op Note (Signed)
06/24/2015  9:15 AM  PATIENT:  Leota JacobsenAllison M Nop  [age] y.o. female  PRE-OPERATIVE DIAGNOSIS:   IUP at term Breech  POST-OPERATIVE DIAGNOSIS:   Same  PROCEDURE:  Procedure(s) with comments: CESAREAN SECTION (N/A) - primary edc 06/25/15 NKDA  SURGEON:  Surgeon(s) and Role:    * Marcelle OverlieMichelle Gamal Todisco, MD - Primary  PHYSICIAN ASSISTANT:   ASSISTANTS: none   ANESTHESIA:   spinal  EBL:  Total I/O In: 2100 [I.V.:2100] Out: 700 [Urine:200; Blood:500]  BLOOD ADMINISTERED:none  DRAINS: Urinary Catheter (Foley)   LOCAL MEDICATIONS USED:  NONE  SPECIMEN:  No Specimen  DISPOSITION OF SPECIMEN:  N/A  COUNTS:  YES  TOURNIQUET:  * No tourniquets in log *  DICTATION: .Other Dictation: Dictation Number [id number]  PLAN OF CARE: Admit  PATIENT DISPOSITION:  PACU - hemodynamically stable.   Delay start of Pharmacological VTE agent (>24hrs) due to surgical blood loss or risk of bleeding: not applicable

## 2015-06-24 NOTE — H&P (Signed)
Linda Carter is a 19 y.o. G 1 P 0 at 39 6 weeks with BREECH Presentation for C Section. Seen in office yesterday with BP 140/88. History OB History    Gravida Para Term Preterm AB TAB SAB Ectopic Multiple Living   1              Past Medical History  Diagnosis Date  . Medical history non-contributory    Past Surgical History  Procedure Laterality Date  . No past surgeries     Family History: family history is not on file. Social History:  reports that she has never smoked. She has never used smokeless tobacco. She reports that she does not drink alcohol or use illicit drugs.   Prenatal Transfer Tool  Maternal Diabetes: No Genetic Screening: Normal Maternal Ultrasounds/Referrals: Normal Fetal Ultrasounds or other Referrals:  None Maternal Substance Abuse:  No Significant Maternal Medications:  None Significant Maternal Lab Results:  None Other Comments:  None  Review of Systems  All other systems reviewed and are negative.     Blood pressure 142/94, pulse 90, temperature 98.2 F (36.8 C), temperature source Oral, resp. rate 20, last menstrual period 09/14/2014, SpO2 100 %. Exam Physical Exam  Nursing note and vitals reviewed. Constitutional: She appears well-developed and well-nourished.  HENT:  Head: Normocephalic.  Eyes: Pupils are equal, round, and reactive to light.  Neck: Normal range of motion.  Cardiovascular: Normal rate and regular rhythm.    ULTRASOUND AT BEDSIDE  - FRANK BREECH PRESENTATION Prenatal labs: ABO, Rh: O/Positive/-- (04/29 0000) Antibody: Negative (04/29 0000) Rubella: Immune (04/29 0000) RPR: Nonreactive (04/29 0000)  HBsAg: Negative (04/29 0000)  HIV: Non-reactive (04/29 0000)  GBS:     Assessment/Plan: IUP at term Breech Presentation PRIMARY LTCS Risks reviewed  Consent signed  Linda Carter 06/24/2015, 8:04 AM

## 2015-06-24 NOTE — Anesthesia Procedure Notes (Signed)
Spinal Patient location during procedure: OR Start time: 06/24/2015 8:33 AM Staffing Anesthesiologist: Mal AmabileFOSTER, Karstyn Birkey Performed by: anesthesiologist  Preanesthetic Checklist Completed: patient identified, site marked, surgical consent, pre-op evaluation, timeout performed, IV checked, risks and benefits discussed and monitors and equipment checked Spinal Block Patient position: sitting Prep: DuraPrep Patient monitoring: heart rate, cardiac monitor, continuous pulse ox and blood pressure Approach: midline Location: L3-4 Injection technique: single-shot Needle Needle type: Sprotte  Needle gauge: 24 G Needle length: 12.7 cm Needle insertion depth: 7 cm Assessment Sensory level: T4 Additional Notes Attempt x 2 due to poor position. 2nd attempt using 17 ga Touhy needle LOR with air. SAB performed through epidural needle. Patient tolerated procedure well. Adequate sensory level.

## 2015-06-24 NOTE — Anesthesia Postprocedure Evaluation (Signed)
  Anesthesia Post-op Note  Patient: Linda Carter  Procedure(s) Performed: Procedure(s) with comments: CESAREAN SECTION (N/A) - primary edc 06/25/15 NKDA  Patient Location: Mother/Baby  Anesthesia Type:Spinal  Level of Consciousness: awake, alert , oriented and patient cooperative  Airway and Oxygen Therapy: Patient Spontanous Breathing  Post-op Pain: none  Post-op Assessment: Post-op Vital signs reviewed, Patient's Cardiovascular Status Stable, Respiratory Function Stable, Patent Airway, No headache, No backache and Patient able to bend at knees              Post-op Vital Signs: Reviewed and stable  Last Vitals:  Filed Vitals:   06/24/15 1712  BP: 103/84  Pulse: 128  Temp:   Resp: 20    Complications: No apparent anesthesia complications

## 2015-06-24 NOTE — Anesthesia Postprocedure Evaluation (Signed)
  Anesthesia Post-op Note  Patient: Linda Carter  Procedure(s) Performed: Procedure(s) with comments: CESAREAN SECTION (N/A) - primary edc 06/25/15 NKDA  Patient Location: PACU  Anesthesia Type:Spinal  Level of Consciousness: awake, alert  and oriented  Airway and Oxygen Therapy: Patient Spontanous Breathing  Post-op Pain: none  Post-op Assessment: Post-op Vital signs reviewed, Patient's Cardiovascular Status Stable, Respiratory Function Stable, Patent Airway, No signs of Nausea or vomiting, Pain level controlled, No headache, No backache and Spinal receding              Post-op Vital Signs: Reviewed and stable  Last Vitals:  Filed Vitals:   06/24/15 1115  BP: 128/92  Pulse: 84  Temp:   Resp: 18    Complications: No apparent anesthesia complications

## 2015-06-24 NOTE — Lactation Note (Signed)
This note was copied from the chart of Linda Particia Nearingllison Fitzgerald. Lactation Consultation Note  Patient Name: Linda Carter GNFAO'ZToday's Date: 06/24/2015 Reason for consult: Initial assessment Baby at 8 hr of life and mom was getting up post c/s for the 1st time. Given lactation handouts, went over feeding frequency, voids, and baby behavior. Mom will need education on manual expression and a feeding assessment. Mom will call at next feeding for LC help.    Maternal Data Has patient been taught Hand Expression?: No Does the patient have breastfeeding experience prior to this delivery?: No  Feeding Feeding Type: Breast Fed Length of feed: 5 min  LATCH Score/Interventions Latch: Repeated attempts needed to sustain latch, nipple held in mouth throughout feeding, stimulation needed to elicit sucking reflex. Intervention(s): Breast compression;Adjust position (cradle to football hold)  Audible Swallowing: A few with stimulation Intervention(s): Skin to skin;Hand expression Intervention(s): Skin to skin;Hand expression  Type of Nipple: Everted at rest and after stimulation (semi flat, everts with reverse and hand expression)  Comfort (Breast/Nipple): Filling, red/small blisters or bruises, mild/mod discomfort (bruising above left nipple)     Hold (Positioning): Assistance needed to correctly position infant at breast and maintain latch.  LATCH Score: 6  Lactation Tools Discussed/Used WIC Program: Yes   Consult Status Consult Status: Follow-up Date: 06/25/15 Follow-up type: In-patient    Linda Carter 06/24/2015, 5:20 PM

## 2015-06-25 LAB — CBC
HCT: 34 % — ABNORMAL LOW (ref 36.0–46.0)
Hemoglobin: 11.4 g/dL — ABNORMAL LOW (ref 12.0–15.0)
MCH: 29.2 pg (ref 26.0–34.0)
MCHC: 33.5 g/dL (ref 30.0–36.0)
MCV: 87 fL (ref 78.0–100.0)
PLATELETS: 256 10*3/uL (ref 150–400)
RBC: 3.91 MIL/uL (ref 3.87–5.11)
RDW: 15.7 % — AB (ref 11.5–15.5)
WBC: 19.4 10*3/uL — AB (ref 4.0–10.5)

## 2015-06-25 LAB — BIRTH TISSUE RECOVERY COLLECTION (PLACENTA DONATION)

## 2015-06-25 NOTE — Lactation Note (Signed)
This note was copied from the chart of Linda Carter Ansell. Lactation Consultation Note  Patient Name: Linda Carter Coil RUEAV'WToday's Date: 06/25/2015 Reason for consult: Follow-up assessment Mom requested that lactation come back tomorrow. She has a room full of visitors, baby was just circumsized, and she has no questions or concerns at this time. RN informed that mom has not received any lactation teaching.   Maternal Data    Feeding    LATCH Score/Interventions                      Lactation Tools Discussed/Used     Consult Status Consult Status: Follow-up Date: 06/26/15 Follow-up type: In-patient    Rulon Eisenmengerlizabeth E Jshaun Abernathy 06/25/2015, 6:32 PM

## 2015-06-25 NOTE — Progress Notes (Signed)
S:  Patient is doing well NO PAIN!!!  O:  BP 108/73 mmHg  Pulse 93  Temp(Src) 99.1 F (37.3 C) (Oral)  Resp 18  SpO2 97%  LMP 09/14/2014  Breastfeeding? Unknown Results for orders placed or performed during the hospital encounter of 06/24/15 (from the past 24 hour(s))  CBC     Status: Abnormal   Collection Time: 06/25/15  6:10 AM  Result Value Ref Range   WBC 19.4 (H) 4.0 - 10.5 K/uL   RBC 3.91 3.87 - 5.11 MIL/uL   Hemoglobin 11.4 (L) 12.0 - 15.0 g/dL   HCT 09.834.0 (L) 11.936.0 - 14.746.0 %   MCV 87.0 78.0 - 100.0 fL   MCH 29.2 26.0 - 34.0 pg   MCHC 33.5 30.0 - 36.0 g/dL   RDW 82.915.7 (H) 56.211.5 - 13.015.5 %   Platelets 256 150 - 400 K/uL  Collect bld for placenta donatation     Status: None   Collection Time: 06/25/15  6:10 AM  Result Value Ref Range   Placenta donation bld collect COLLECTED BY LABORATORY    Abdomen is soft and non tender  IMPRESSION: POD #1 Doing well Routine care Circ today or tomorrow

## 2015-06-25 NOTE — Op Note (Signed)
NAMIdelle Jo:  Kibble, Roma             ACCOUNT NO.:  [id number]  MEDICAL RECORD NO.:  [id number]  LOCATION:  9103                          FACILITY:  [facility]  PHYSICIAN:  Landree Fernholz L. Angel Weedon, M.D.DATE OF BIRTH:  10-Jul-1996  DATE OF PROCEDURE:  06/24/2015 DATE OF DISCHARGE:                              OPERATIVE REPORT   PREOPERATIVE DIAGNOSES:  Intrauterine pregnancy at term and breech presentation.  POSTOPERATIVE DIAGNOSES:  Intrauterine pregnancy at term and breech presentation.  PROCEDURE:  Primary low transverse cesarean section.  SURGEON:  Emrey Thornley L. Vincente PoliGrewal, M.D.  ANESTHESIA:  Spinal.  ESTIMATED BLOOD LOSS:  500 mL.  COMPLICATIONS:  None.  DRAINS:  Foley.  PATHOLOGY:  None.  DESCRIPTION OF PROCEDURE:  The patient was taken to the operating room. She had been consented about the risk associated with the procedure. She was prepped and draped in the usual sterile fashion.  A Foley catheter was inserted.  A low transverse incision was made, carried down to the fascia, and the fascia scored in the midline and the rectus muscles were separated in the midline.  The peritoneum was entered bluntly.  The peritoneal incision was then stretched.  The lower uterine segment was identified.  The bladder flap was created sharply and then digitally.  The bladder blade was then readjusted.  A low transverse incision was made in the uterus.  Uterus was entered using a hemostat. The amniotic fluid was clear.  The baby was then in frank breech presentation, was a female infant, and delivered easily.  The baby was vigorous on the abdomen.  The cord was clamped and cut.  The baby was handed to the awaiting Neonatal team.  The placenta was manually removed, noted to be normal, intact with a 3-vessel cord.  The uterus was exteriorized and cleared of all clots and debris.  The uterus was then returned to the abdomen.  Irrigation was performed.  The peritoneum was closed using 0 Vicryl.  The fascia  was closed using 0 Vicryl in a running stitch.  The skin was closed with a 4-0 Vicryl on a Keith needle.  All sponge, lap, instrument counts were correct x2.  The patient went to recovery room in stable condition.     Keymiah Lyles L. Vincente PoliGrewal, M.D.     Florestine AversMLG/MEDQ  D:  06/24/2015  T:  06/25/2015  Job:  [id number]

## 2015-06-26 NOTE — Progress Notes (Signed)
Mother requested to speak with CSW regarding Medicaid.  Met with mother and provided her information requested on medication.  No social concern noted or identified at this time.

## 2015-06-26 NOTE — Progress Notes (Signed)
Patient doing well.  BP 121/64 mmHg  Pulse 83  Temp(Src) 98.6 F (37 C) (Oral)  Resp 17  SpO2 98%  LMP 09/14/2014  Breastfeeding? Unknown No results found for this or any previous visit (from the past 24 hour(s)). Abdomen is soft and non tender Bandage clean and dry  IMPRESSION: POD #2 Doing well Routine care Discharge tomorrow

## 2015-06-27 ENCOUNTER — Encounter (HOSPITAL_COMMUNITY): Payer: Self-pay | Admitting: Obstetrics and Gynecology

## 2015-06-27 MED ORDER — IBUPROFEN 600 MG PO TABS: 600.0000 mg | ORAL_TABLET | Freq: Four times a day (QID) | ORAL | Status: AC | PRN

## 2015-06-27 MED ORDER — OXYCODONE-ACETAMINOPHEN 5-325 MG PO TABS: 1.0000 | ORAL_TABLET | ORAL | Status: AC | PRN

## 2015-06-27 NOTE — Lactation Note (Signed)
This note was copied from the chart of Linda Carter [place]. Lactation Consultation Note Mom has been post pumping and giving colostrum to baby in bottle w/slow flow nipple. RN reported breast are filling. Mom stated breast are leaking. Assessed breast, filling heavy, nipples are filling less compressible. Encouraged to hand express or pre-pump prior to BF to soften her breast some to latch easier. Nipples pink, short shaft nipples. Encouraged mom to BF longer, massage breast during BF. Has comfort gels.  Using DEBP, has similar pump at home. Encouraged to pump for 15 minutes and give colostrum as supplement. Mom has large nipples, requested larger flanges, has #27. Gave #30, and #36 flanges.  Patient Name: Linda Carter Diviney JXBJY'NToday's Date: 06/27/2015 Reason for consult: Follow-up assessment   Maternal Data    Feeding Feeding Type: Breast Milk Nipple Type: Slow - flow  LATCH Score/Interventions Intervention(s): Skin to skin;Teach feeding cues;Waking techniques  Intervention(s): Hand expression;Skin to skin Intervention(s): Alternate breast massage  Type of Nipple: Everted at rest and after stimulation  Comfort (Breast/Nipple): Filling, red/small blisters or bruises, mild/mod discomfort  Problem noted: Mild/Moderate discomfort Interventions (Mild/moderate discomfort): Post-pump;Pre-pump if needed;Hand massage;Hand expression;Comfort gels  Intervention(s): Support Pillows;Position options;Breastfeeding basics reviewed;Skin to skin     Lactation Tools Discussed/Used Tools: Pump;Comfort gels;Flanges Flange Size: 30 Breast pump type: Double-Electric Breast Pump   Consult Status Consult Status: Complete Date: 06/27/15    Charyl DancerCARVER, Maribell Demeo G 06/27/2015, 7:12 AM

## 2015-06-27 NOTE — Discharge Summary (Signed)
Obstetric Discharge Summary Reason for Admission: cesarean section Prenatal Procedures: ultrasound Intrapartum Procedures: cesarean: low cervical, transverse Postpartum Procedures: none Complications-Operative and Postpartum: none HEMOGLOBIN  Date Value Ref Range Status  06/25/2015 11.4* 12.0 - 15.0 Carter/dL Final   HCT  Date Value Ref Range Status  06/25/2015 34.0* 36.0 - 46.0 % Final    Physical Exam:  General: alert and cooperative Lochia: appropriate Uterine Fundus: firm Incision: healing well DVT Evaluation: No evidence of DVT seen on physical exam. Negative Homan's sign. No cords or calf tenderness. No significant calf/ankle edema.  Discharge Diagnoses: Term Pregnancy-delivered  Discharge Information: Date: 06/27/2015 Activity: pelvic rest Diet: routine Medications: PNV, Ibuprofen and Percocet Condition: stable Instructions: refer to practice specific booklet Discharge to: home   Newborn Data: Live born female  Birth Weight: 7 lb 15.2 oz (3605 Carter) APGAR: 9, 9  Home with mother.  Linda Carter 06/27/2015, 7:58 AM

## 2015-12-23 ENCOUNTER — Encounter (HOSPITAL_COMMUNITY): Payer: Self-pay

## 2016-02-05 IMAGING — MR MR PELVIS W/O CM
6 of 14 series · 19 of 48 positions shown · non-contrast
Comparison: None.

CLINICAL DATA: Patient is 31 weeks 5 days pregnant. Right lower
quadrant pain beginning at 7733 hours. Vomiting. Pain in the hips.

EXAM:
MRI ABDOMEN AND PELVIS WITHOUT CONTRAST
TECHNIQUE: Multiplanar multisequence MR imaging of the abdomen and pelvis was
performed. No intravenous contrast was administered.

[Series 4: bSSFP · axial · 5.0mm · 0.86mm/px · z∈[-133,+125]mm · 3 of 44 slices shown (1 of 2)]
[im 1/44]
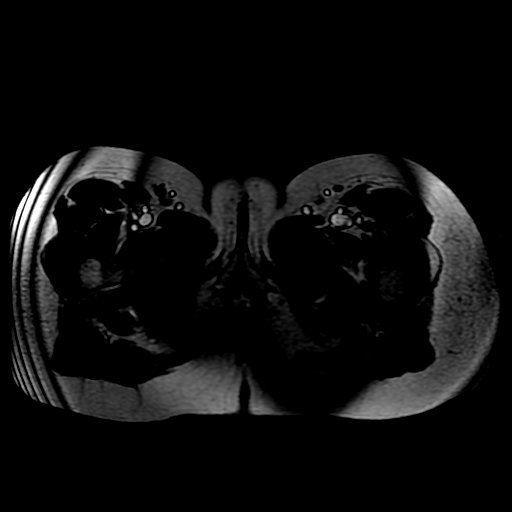
[im 22/44]
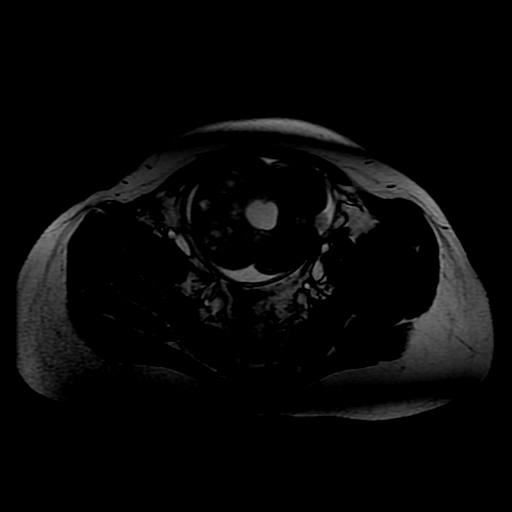
[im 44/44]
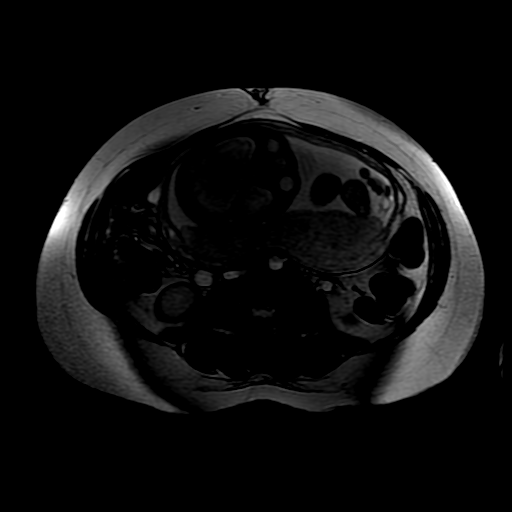

[Series 5: T2 fat-sat · axial · 5.0mm · 0.86mm/px · z∈[-133,+125]mm · 3 of 44 slices shown (1 of 2)]
[im 1/44]
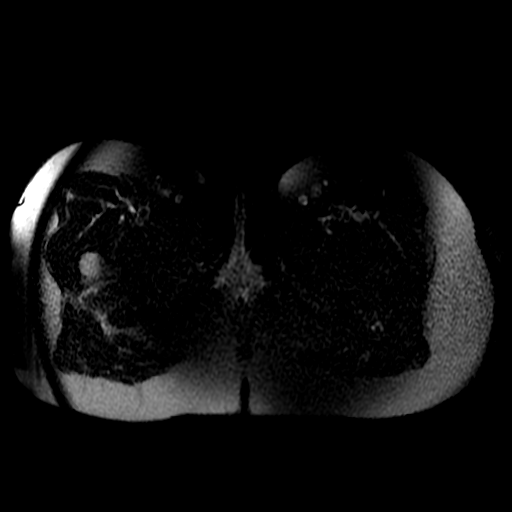
[im 22/44]
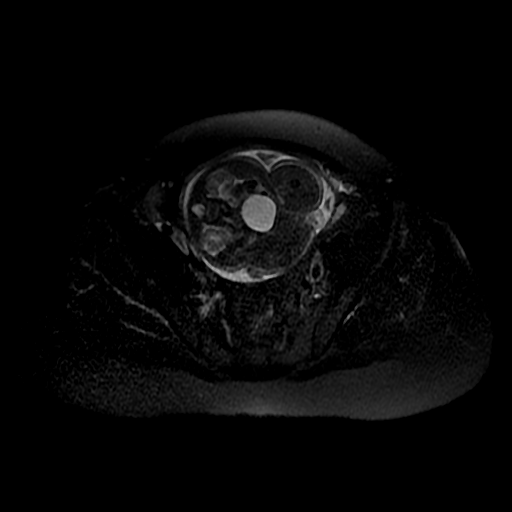
[im 44/44]
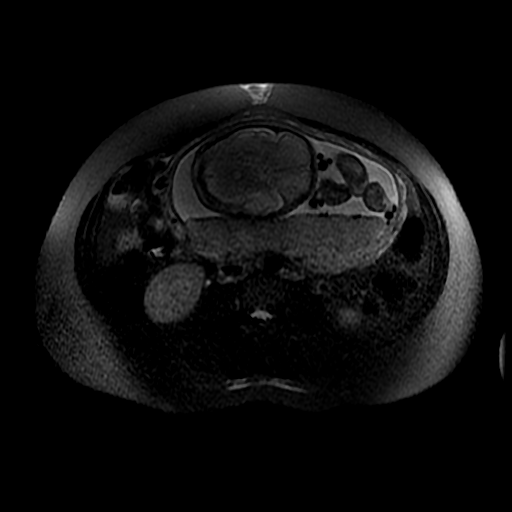

[Series 6: T2 · coronal · 6.0mm · 0.86mm/px · 2 of 32 slices shown]
[im 1/32]
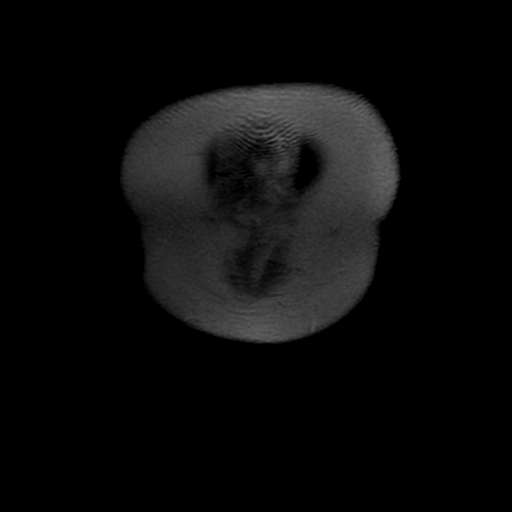
[im 32/32]
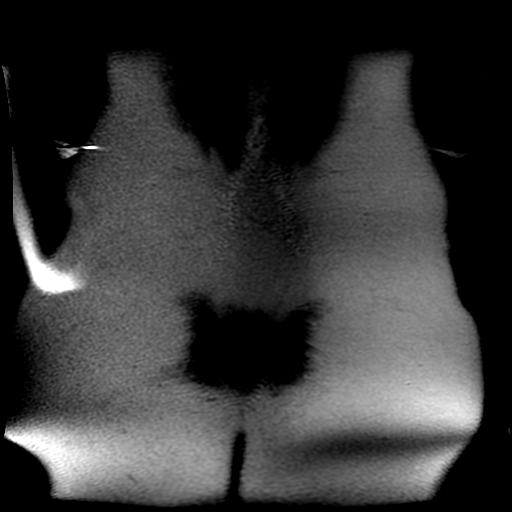

[Series 7: T2 fat-sat · coronal · 6.0mm · 0.86mm/px · 2 of 32 slices shown (2 of 2)]
[im 1/32]
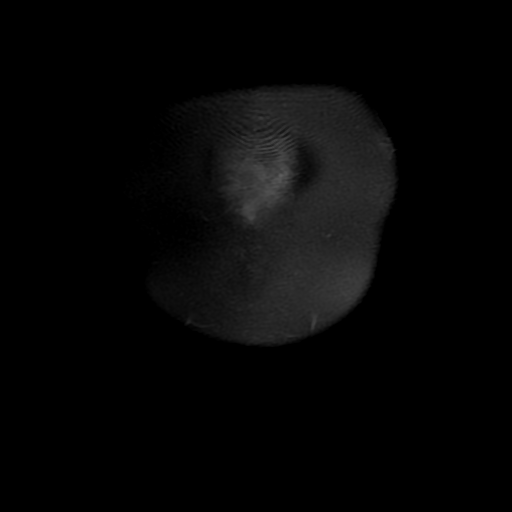
[im 32/32]
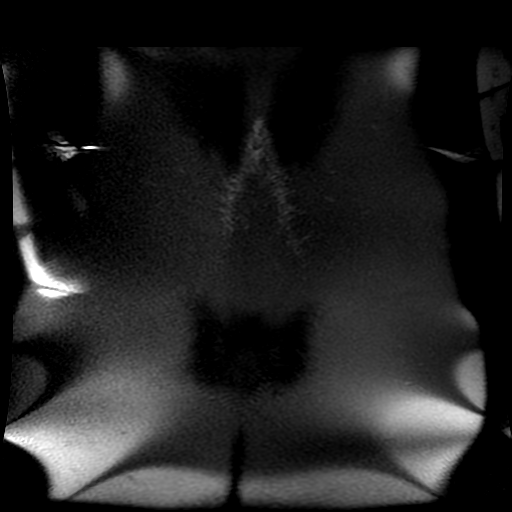

[Series 8: bSSFP · coronal · 5.0mm · 0.86mm/px · 3 of 37 slices shown (2 of 2)]
[im 1/37]
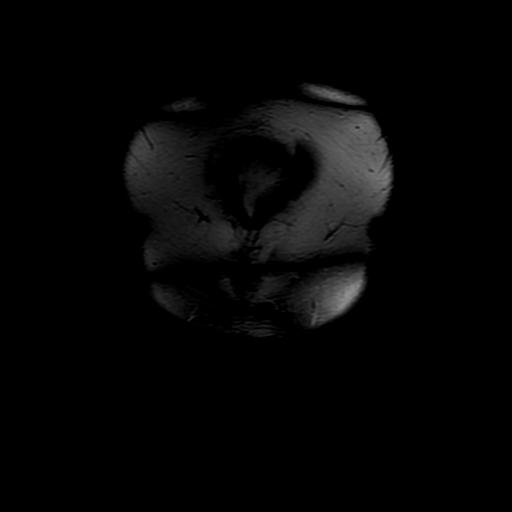
[im 19/37]
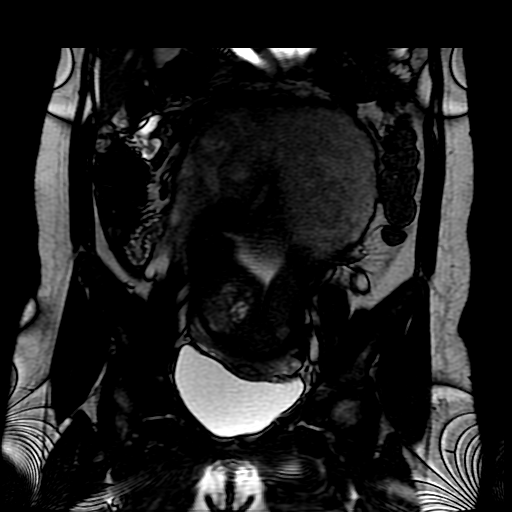
[im 37/37]
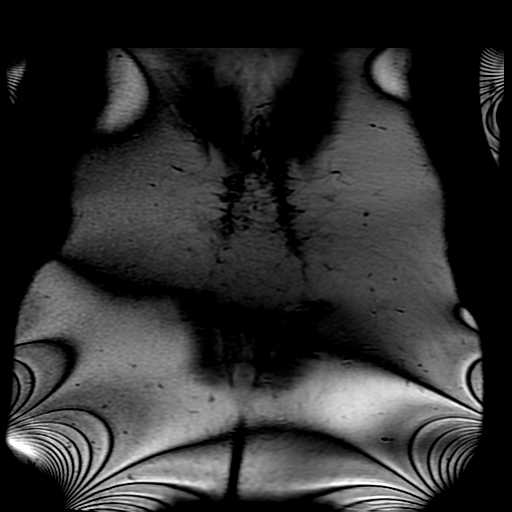

[Series 9: T1 dynamic · axial · 5.0mm · 0.78mm/px · z∈[-94,+103]mm · 6 of 96 slices shown]
[im 1/96]
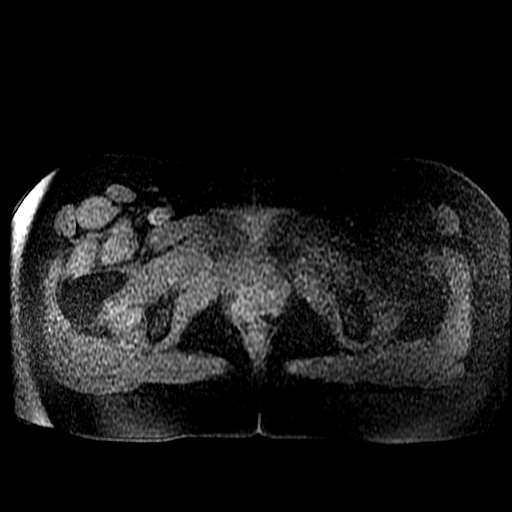
[im 16/96]
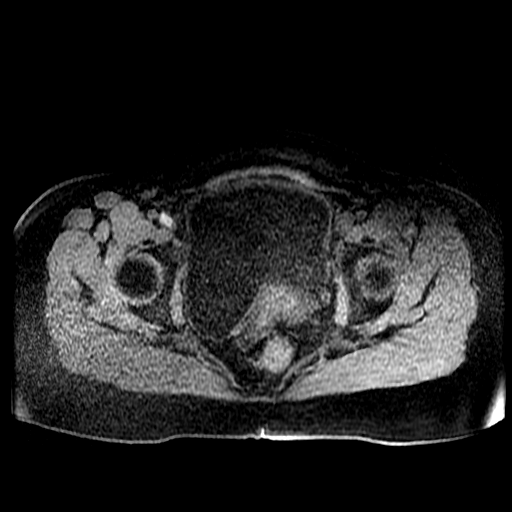
[im 32/96]
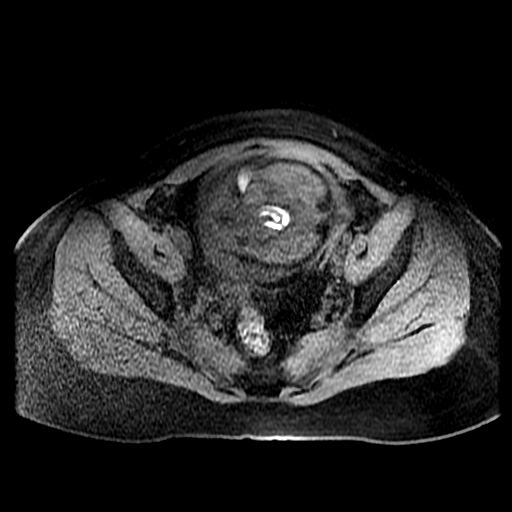
[im 48/96]
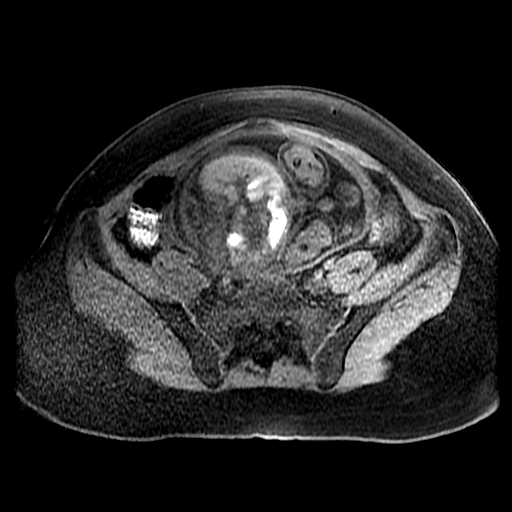
[im 64/96]
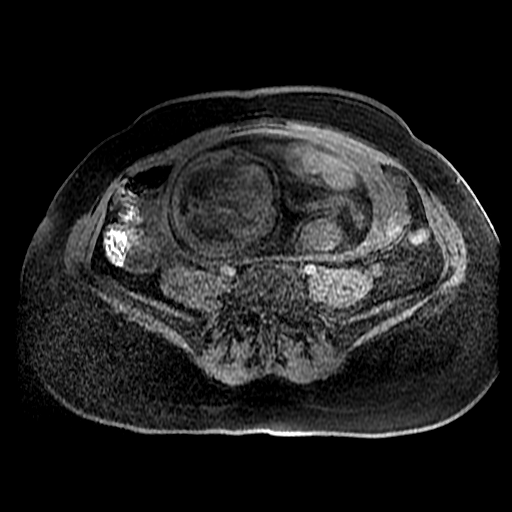
[im 80/96]
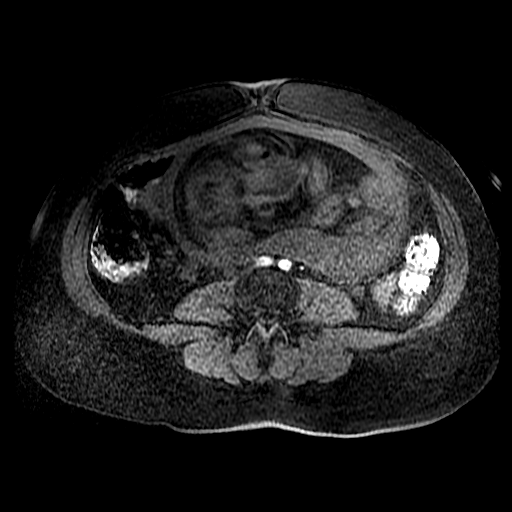

[19 of 48 positions shown; findings below may reference images not displayed]

FINDINGS: MRI ABDOMEN FINDINGS

Mild dilatation of renal collecting systems and ureters bilaterally,
likely due to extrinsic compression from the pregnancy. Liver,
spleen, pancreas, gallbladder, bile ducts, and adrenal glands are
unremarkable. Colon and small bowel are not abnormally distended. No
free fluid in the abdomen.

MRI PELVIS FINDINGS

A single intrauterine pregnancy is demonstrated. The fetus is in
breech presentation. Placenta is posterior and fundal.

Both ovaries are visualized and appear normal. Bladder wall is not
thickened. No free or loculated pelvic fluid collections. The
appendix is segmentally identified and appears normal. No
periappendiceal fluid is demonstrated.
IMPRESSION: No evidence to suggest appendicitis. Mild dilatation of the renal
collecting systems likely due to extrinsic compression of the
ureters from the pregnancy.

## 2020-10-04 ENCOUNTER — Encounter (INDEPENDENT_AMBULATORY_CARE_PROVIDER_SITE_OTHER): Payer: Self-pay
